# Patient Record
Sex: Male | Born: 2005 | Race: Black or African American | Hispanic: No | Marital: Single | Smoking: Never smoker
Health system: Southern US, Community
[De-identification: ages and names within clinical notes are randomized; demographics above are authoritative.]

---
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
## 2023-02-28 ENCOUNTER — Emergency Department
Admission: EM | Admit: 2023-02-28 | Discharge: 2023-02-28 | Discharge status: Against Medical Advice | Payer: Medicaid Other | Attending: Emergency Medicine | Admitting: Emergency Medicine

## 2023-02-28 ENCOUNTER — Emergency Department: Payer: Medicaid Other

## 2023-02-28 ENCOUNTER — Other Ambulatory Visit: Payer: Self-pay

## 2023-02-28 DIAGNOSIS — Z5321 Procedure and treatment not carried out due to patient leaving prior to being seen by health care provider: Secondary | ICD-10-CM | POA: Insufficient documentation

## 2023-02-28 DIAGNOSIS — Z1152 Encounter for screening for COVID-19: Secondary | ICD-10-CM | POA: Diagnosis not present

## 2023-02-28 DIAGNOSIS — J029 Acute pharyngitis, unspecified: Secondary | ICD-10-CM | POA: Diagnosis present

## 2023-02-28 DIAGNOSIS — R0981 Nasal congestion: Secondary | ICD-10-CM | POA: Diagnosis not present

## 2023-02-28 DIAGNOSIS — R059 Cough, unspecified: Secondary | ICD-10-CM | POA: Insufficient documentation

## 2023-02-28 LAB — RESP PANEL BY RT-PCR (RSV, FLU A&B, COVID)  RVPGX2
Influenza A by PCR: NEGATIVE
Influenza B by PCR: NEGATIVE
Resp Syncytial Virus by PCR: NEGATIVE
SARS Coronavirus 2 by RT PCR: NEGATIVE

## 2023-02-28 LAB — GROUP A STREP BY PCR: Group A Strep by PCR: NOT DETECTED

## 2023-02-28 NOTE — ED Notes (Addendum)
Pt has not returned

## 2023-02-28 NOTE — ED Triage Notes (Signed)
Pt reports sore throat xfew days. Pt also reports cough congestion

## 2023-02-28 NOTE — ED Notes (Signed)
Pt has not returned
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
## 2023-07-14 ENCOUNTER — Emergency Department: Payer: Self-pay

## 2023-07-14 ENCOUNTER — Emergency Department
Admission: EM | Admit: 2023-07-14 | Discharge: 2023-07-14 | Disposition: A | Discharge status: Home / Self Care | Payer: Self-pay | Attending: Emergency Medicine | Admitting: Emergency Medicine

## 2023-07-14 ENCOUNTER — Other Ambulatory Visit: Payer: Self-pay

## 2023-07-14 ENCOUNTER — Encounter: Payer: Self-pay | Admitting: Emergency Medicine

## 2023-07-14 DIAGNOSIS — X088XXA Exposure to other specified smoke, fire and flames, initial encounter: Secondary | ICD-10-CM | POA: Insufficient documentation

## 2023-07-14 DIAGNOSIS — R519 Headache, unspecified: Secondary | ICD-10-CM

## 2023-07-14 DIAGNOSIS — M545 Low back pain, unspecified: Secondary | ICD-10-CM | POA: Insufficient documentation

## 2023-07-14 DIAGNOSIS — J705 Respiratory conditions due to smoke inhalation: Secondary | ICD-10-CM | POA: Insufficient documentation

## 2023-07-14 DIAGNOSIS — S29011A Strain of muscle and tendon of front wall of thorax, initial encounter: Secondary | ICD-10-CM | POA: Insufficient documentation

## 2023-07-14 DIAGNOSIS — T59811A Toxic effect of smoke, accidental (unintentional), initial encounter: Secondary | ICD-10-CM | POA: Insufficient documentation

## 2023-07-14 DIAGNOSIS — R42 Dizziness and giddiness: Secondary | ICD-10-CM | POA: Insufficient documentation

## 2023-07-14 MED ORDER — IBUPROFEN 600 MG PO TABS: 600.0000 mg | ORAL_TABLET | Freq: Three times a day (TID) | ORAL | 0 refills | Status: AC | PRN

## 2023-07-14 MED ORDER — CYCLOBENZAPRINE HCL 10 MG PO TABS: 10.0000 mg | ORAL_TABLET | Freq: Three times a day (TID) | ORAL | 0 refills | Status: AC | PRN

## 2023-07-14 MED ORDER — IBUPROFEN 800 MG PO TABS
800.0000 mg | ORAL_TABLET | Freq: Once | ORAL | Status: AC
  Administered 2023-07-14: 800 mg via ORAL
  Filled 2023-07-14: qty 1

## 2023-07-14 MED ORDER — IBUPROFEN 600 MG PO TABS: 600.0000 mg | ORAL_TABLET | Freq: Three times a day (TID) | ORAL | 0 refills | Status: DC | PRN
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
## 2023-08-12 ENCOUNTER — Ambulatory Visit: Payer: Self-pay

## 2023-08-15 ENCOUNTER — Ambulatory Visit: Payer: Self-pay

## 2023-09-03 ENCOUNTER — Ambulatory Visit: Payer: Self-pay

## 2023-11-15 ENCOUNTER — Ambulatory Visit: Payer: Self-pay

## 2023-11-15 DIAGNOSIS — Z202 Contact with and (suspected) exposure to infections with a predominantly sexual mode of transmission: Secondary | ICD-10-CM

## 2023-11-15 DIAGNOSIS — Z113 Encounter for screening for infections with a predominantly sexual mode of transmission: Secondary | ICD-10-CM

## 2023-11-15 LAB — HM HEPATITIS C SCREENING LAB: HM Hepatitis Screen: NEGATIVE

## 2023-11-15 LAB — HM HIV SCREENING LAB: HM HIV Screening: NEGATIVE

## 2023-11-15 MED ORDER — DOXYCYCLINE HYCLATE 100 MG PO TABS: 100.0000 mg | ORAL_TABLET | Freq: Two times a day (BID) | ORAL | Status: AC

## 2023-11-15 NOTE — Progress Notes (Signed)
 The patient was dispensed #28 doxycycline  today. I provided counseling today regarding the medication. We discussed the medication, the side effects and when to call clinic. Patient given the opportunity to ask questions. Questions answered.  Larraine JONELLE Northern, RN

## 2023-11-15 NOTE — Progress Notes (Signed)
 [facility] Department STI clinic [address], [address] [place] [address] Main phone: [phone]  STI screening visit  Subjective:  Timothy Marshall is a 18 y.o. male being seen today for an STI screening visit. The patient reports they do have symptoms.    Patient has the following medical conditions:  Patient Active Problem List   Diagnosis Date Noted   Well child check 12/17/2013   Sacral back pain 12/17/2013   Failed vision screen 12/17/2013   Seasonal allergic rhinitis 12/17/2013   BMI (body mass index), pediatric, 5% to less than 85% for age 75/10/2013   Chief Complaint  Patient presents with   SEXUALLY TRANSMITTED DISEASE   HPI Patient reports a bump on his groin. Somewhat painful. Has been there for a few days. Desires STI testing today. He also is a contact for syphilis. Reports his girlfriend who is pregnant and due in September was treated for syphilis 3 months ago. He never got tested or treated when she received her syphilis diagnosed. She was treated, however they have continued to have sex.  Asked him about syphilis symptoms, and he reports he may have had a sore on his penis like that a few months ago. Has not had any systemic symptoms, rash. He does have a sore throat today. No vision or balance issues.  See flowsheet for further details and programmatic requirements  Hyperlink available at the top of the signed note in blue.  Flow sheet content below:  Pregnancy Intention Screening Does the patient want to become pregnant in the next year?: No Does the patient's partner want to become pregnant in the next year?: No Would the patient like to discuss contraceptive options today?: No Reason For STD Screen STD Screening: Has symptoms Have you ever had an STD?: Yes History of Antibiotic use in the past 2 weeks?: No STD Symptoms Denies all: No Genital Itching: No Lower abdominal pain: No Discharge: No Dysuria: No Genital  ulcer / lesion: Yes Rash: No Oral / Other skin ulcer: No Pain with sex: No Sore Throat: Yes Visual Changes: No Risk Factors for Hep B Household, sexual, or needle sharing contact of a person infected with Hep B: No Sexual contact with a person who uses drugs not as prescribed?: No Currently or Ever used drugs not as prescribed: No HIV Positive: No PRep Patient: No Men who have sex with men: No Have Hepatitis C: No History of Incarceration: No History of Homeslessness?: Yes Anal sex following anal drug use?: No Risk Factors for Hep C Currently using drugs not as prescribed: No Sexual partner(s) currently using drugs as not prescribed: No History of drug use: No HIV Positive: No People with a history of incarceration: No People born between the years of 65 and 53: No Hepatitis Counseling Hep B Counseling: Counseled patient about increased risk of Hep B and recommendation for testing, Patient accepts testing for Hep B today Advise Advised client to quit or stay quit. : Yes Counseling Medication side effects discussed with patient?: Yes Contact card(s) given to patient: No Patient counseled to abstain from sex for:  (21) Patient counseled to abstain from sex for?: 7 days post partner's treatment Patient counseled to use condoms with all sex: Condoms declined RTC in 2-3 weeks for test results: Yes Clinic will call if test results abnormal before test result appt.: Yes Test results given to patient Patient counseled to use condoms with all sex: Condoms declined STD Treatment Patient counseled to abstain from  sex for:  (21) Patient counseled to abstain from sex for?: 7 days post partner's treatment  Screening for MPX risk: Does the patient have an unexplained rash? No Is the patient MSM? No Does the patient endorse multiple sex partners or anonymous sex partners? No Did the patient have close or sexual contact with a person diagnosed with MPX? No Has the patient traveled  outside the [place]  where MPX is endemic? No Is there a high clinical suspicion for MPX-- evidenced by one of the following No  -Unlikely to be chickenpox  -Lymphadenopathy  -Rash that present in same phase of evolution on any given body part  STI screening history: Last HIV test per patient/review of record was No results found for: HMHIVSCREEN No results found for: HIV  Last HEPC test per patient/review of record was No results found for: HMHEPCSCREEN No components found for: HEPC   Last HEPB test per patient/review of record was No components found for: HMHEPBSCREEN   Fertility: Does the patient or their partner desires a pregnancy in the next year? No  Immunization History  Administered Date(s) Administered   DTaP 04/21/2004, 06/27/2004, 11/07/2004, 12/07/2005, 07/14/2008   HIB (PRP-OMP) 04/21/2004, 06/27/2004, 11/07/2004, 12/07/2005   Hepatitis B 28[birth year]Nov-2003, 03/09/2004, 07/03/2004, 02/25/2013   IPV 04/21/2004, 06/27/2004, 11/04/2004, 07/14/2008   Influenza Split 01/11/2011, 02/25/2013   Influenza,Quad,Nasal, Live 12/17/2013   MMR 12/07/2005, 07/14/2008   Pneumococcal Conjugate-13 04/21/2004, 06/27/2004, 11/07/2004, 03/27/2005   Varicella 03/27/2005, 07/14/2008    The following portions of the patient's history were reviewed and updated as appropriate: allergies, current medications, past medical history, past social history, past surgical history and problem list.  Objective:  There were no vitals filed for this visit.  Physical Exam Exam conducted with a chaperone present (declined chaperone).  Constitutional:      Appearance: Normal appearance.  HENT:     Head: Normocephalic and atraumatic.     Comments: No nits or hair loss    Mouth/Throat:     Mouth: Mucous membranes are moist. No oral lesions.     Pharynx: Oropharynx is clear. No oropharyngeal exudate or posterior oropharyngeal erythema.  Eyes:     General:        Right eye: No discharge.        Left eye:  No discharge.     Conjunctiva/sclera:     Right eye: Right conjunctiva is not injected. No exudate.    Left eye: Left conjunctiva is not injected. No exudate. Pulmonary:     Effort: Pulmonary effort is normal.  Abdominal:     General: Abdomen is flat.  Genitourinary:    Pubic Area: No rash or pubic lice (no nits).      Penis: Normal. No tenderness, discharge, swelling or lesions.      Testes: Normal.     Rectum: Normal. No tenderness (no lesions or discharge).       Comments: Very small raised area of skin without erythema, drainage, or broken skin. Likely a folliculitis. Lymphadenopathy:     Head:     Right side of head: No preauricular or posterior auricular adenopathy.     Left side of head: No preauricular or posterior auricular adenopathy.     Cervical: No cervical adenopathy.     Upper Body:     Right upper body: No supraclavicular, axillary or epitrochlear adenopathy.     Left upper body: No supraclavicular, axillary or epitrochlear adenopathy.     Lower Body: No right inguinal adenopathy. No left inguinal adenopathy.  Skin:    General: Skin is warm and dry.     Findings: No lesion or rash.  Neurological:     Mental Status: He is alert and oriented to person, place, and time.    Assessment and Plan:  Cole Jamal Delauter is a 18 y.o. male presenting to the [facility] Department for STI screening  1. Screening for venereal disease (Primary)  - Chlamydia/GC NAA, Confirmation - HIV/HCV Stella Lab - HBV Antigen/Antibody State Lab - Syphilis Serology, [place] [facility]  2. Syphilis contact  - Had never had syphilis testing, but recalls a possible penile painless sore a few months ago - Will treat as a contact/early syphilis  - doxycycline  (VIBRA -TABS) 100 MG tablet; Take 1 tablet (100 mg total) by mouth 2 (two) times daily for 14 days.  - Discussed importance of NOT having any sexual contact until he is fully treated and that his pregnant girlfriend needs  follow up and re-testing/re-treatment for her probable continued exposure after she was treated a few months ago  Patient does not have STI symptoms Patient accepted the following screenings: urine CT/GC, HIV, RPR, Hep B, and Hep C Patient meets criteria for HepB screening? Yes. Ordered? yes Patient meets criteria for HepC screening? Yes. Ordered? yes Recommended condom use with all sex Discussed importance of condom use for STI prevention  Treat positive test results per standing order. Discussed time line for State Lab results and that patient will be called with positive results and encouraged patient to call if he had not heard in 2 weeks Recommended repeat testing in 3 months with positive results. Recommended returning for continued or worsening symptoms.   Return in about 3 months (around 02/14/2024).  No future appointments.  Damien FORBES Satchel, NP

## 2023-11-18 LAB — CHLAMYDIA/GC NAA, CONFIRMATION
Chlamydia trachomatis, NAA: NEGATIVE
Neisseria gonorrhoeae, NAA: NEGATIVE

## 2023-11-19 LAB — SYPHILIS SEROLOGY, ~~LOC~~ LAB
RPR, Quant: 1:4 {titer}
RPR: REACTIVE
Syphilis Treponemal Ab: REACTIVE

## 2023-11-20 LAB — HBV ANTIGEN/ANTIBODY STATE LAB
Hep B Core Total Ab: NONREACTIVE
Hep B S Ab: REACTIVE
Hepatitis B Surface Antigen: NONREACTIVE

## 2023-11-26 ENCOUNTER — Telehealth: Payer: Self-pay

## 2023-11-26 ENCOUNTER — Ambulatory Visit: Payer: Self-pay

## 2023-11-26 NOTE — Progress Notes (Signed)
 Syphilis test positive. Patient already treated appropriately presumptively on 11/15/23. No new orders.  Damien Satchel [facility]

## 2023-11-26 NOTE — Telephone Encounter (Signed)
 Phone call to [phone]. Rec'd messages stating number has been changed, disconnected or no longer in service. Tried twice.  [phone], # not in service. Tried twice.  MyChart not available.

## 2023-11-26 NOTE — Telephone Encounter (Addendum)
 Inform pt of positive syphilis result, already received tx. Rescreen in 6 months. RPR = Reactive Titer= 1:4 TP= Reactive  Per Timothy Marshall, DIS, no hx of syphilis documented.

## 2023-11-26 NOTE — Telephone Encounter (Signed)
 Phone call to additional contact number listed for pt in demographics, [phone]. Went straight to a busy signal. Tried twice.

## 2023-12-03 NOTE — Telephone Encounter (Signed)
 Phone call to [phone]. Rec'd messages stating number has been changed, disconnected or no longer in service.   Phone call to [phone]  Not in service.  Phone call to [phone]. Immediately received busy signal.  MyChart not available/in use.

## 2023-12-04 NOTE — Telephone Encounter (Signed)
 12/04/23 mailed letter re important health matter, please contact [facility].

## 2023-12-09 NOTE — Telephone Encounter (Addendum)
-  12/05/23 and 12/06/23, Field visits by DIS, Fredric Geralds, to [facility] address. -Tried alternative address on [address]. -Not able to make contact as of 12/09/23.
